# Patient Record
Sex: Female | Born: 1950 | Race: White | Hispanic: No | Smoking: Current every day smoker
Health system: Southern US, Community
[De-identification: ages and names within clinical notes are randomized; demographics above are authoritative.]

## PROBLEM LIST (undated history)

## (undated) HISTORY — PX: APPENDECTOMY: SHX54

---

## 2015-12-08 ENCOUNTER — Emergency Department (INDEPENDENT_AMBULATORY_CARE_PROVIDER_SITE_OTHER)
Admission: EM | Admit: 2015-12-08 | Discharge: 2015-12-08 | Disposition: A | Discharge status: Home / Self Care | Source: Home / Self Care | Attending: Family Medicine | Admitting: Family Medicine

## 2015-12-08 ENCOUNTER — Emergency Department (INDEPENDENT_AMBULATORY_CARE_PROVIDER_SITE_OTHER)

## 2015-12-08 ENCOUNTER — Encounter: Payer: Self-pay | Admitting: Emergency Medicine

## 2015-12-08 DIAGNOSIS — S2241XA Multiple fractures of ribs, right side, initial encounter for closed fracture: Secondary | ICD-10-CM

## 2015-12-08 DIAGNOSIS — W19XXXA Unspecified fall, initial encounter: Secondary | ICD-10-CM

## 2015-12-08 MED ORDER — KETOROLAC TROMETHAMINE 30 MG/ML IJ SOLN
30.0000 mg | Freq: Once | INTRAMUSCULAR | Status: AC
  Administered 2015-12-08: 30 mg via INTRAMUSCULAR

## 2015-12-08 MED ORDER — HYDROCODONE-ACETAMINOPHEN 10-325 MG PO TABS: 1.0000 | ORAL_TABLET | Freq: Four times a day (QID) | ORAL | 0 refills | Status: AC | PRN

## 2015-12-08 NOTE — ED Provider Notes (Addendum)
[facility]    CSN: [id number] Arrival date & time: 12/08/15  1354     History   Chief Complaint Chief Complaint  Patient presents with  . Fall    HPI Tasha Green is a 65 y.o. female.   Patient reports that she slipped in her tub last night and fell, landing on her right ribs and posterior chest.  She denies shortness of breath.  No loss of consciousness.   The history is provided by the patient.  Fall  This is a new problem. The current episode started yesterday. The problem has not changed since onset.Associated symptoms include chest pain. Pertinent negatives include no abdominal pain and no shortness of breath. The symptoms are aggravated by walking, coughing and twisting. Nothing relieves the symptoms. Treatments tried: Aleve. The treatment provided no relief.    History reviewed. No pertinent past medical history.  There are no active problems to display for this patient.   Past Surgical History:  Procedure Laterality Date  . APPENDECTOMY      OB History    No data available       Home Medications    Prior to Admission medications   Medication Sig Start Date End Date Taking? Authorizing Provider  aspirin 325 MG tablet Take 325 mg by mouth daily.   Yes Historical Provider, MD  cholecalciferol (VITAMIN D) 400 units TABS tablet Take 400 Units by mouth.   Yes Historical Provider, MD  Omega-3 Fatty Acids (FISH OIL) 1000 MG CAPS Take by mouth.   Yes Historical Provider, MD  verapamil (CALAN) 120 MG tablet Take 120 mg by mouth 3 (three) times daily.   Yes Historical Provider, MD  vitamin B-12 (CYANOCOBALAMIN) 100 MCG tablet Take 100 mcg by mouth daily.   Yes Historical Provider, MD  HYDROcodone-acetaminophen (NORCO) 10-325 MG tablet Take 1 tablet by mouth every 6 (six) hours as needed. 12/08/15   Lattie HawStephen A Beese, MD    Family History No family history on file.  Social History Social History  Substance Use Topics  . Smoking status: Current  Every Day Smoker    Packs/day: 1.00    Years: 50.00    Types: Cigarettes  . Smokeless tobacco: Never Used  . Alcohol use Yes     Allergies   Patient has no known allergies.   Review of Systems Review of Systems  Respiratory: Negative for shortness of breath.   Cardiovascular: Positive for chest pain.  Gastrointestinal: Negative for abdominal pain.  All other systems reviewed and are negative.    Physical Exam Triage Vital Signs ED Triage Vitals  Enc Vitals Group     BP 12/08/15 1429 180/94     Pulse Rate 12/08/15 1429 78     Resp --      Temp 12/08/15 1429 98 F (36.7 C)     Temp Source 12/08/15 1429 Oral     SpO2 12/08/15 1429 97 %     Weight --      Height --      Head Circumference --      Peak Flow --      Pain Score 12/08/15 1434 9     Pain Loc --      Pain Edu? --      Excl. in GC? --    No data found.   Updated Vital Signs BP 180/94 (BP Location: Left Arm)   Pulse 78   Temp 98 F (36.7 C) (Oral)   SpO2 97%  Visual Acuity Right Eye Distance:   Left Eye Distance:   Bilateral Distance:    Right Eye Near:   Left Eye Near:    Bilateral Near:     Physical Exam  Constitutional: She appears well-developed and well-nourished. No distress.  HENT:  Head: Atraumatic.  Right Ear: External ear normal.  Left Ear: External ear normal.  Nose: Nose normal.  Mouth/Throat: Oropharynx is clear and moist.  Eyes: Conjunctivae are normal. Pupils are equal, round, and reactive to light.  Neck: Normal range of motion.  Cardiovascular: Normal heart sounds.   Pulmonary/Chest: Effort normal and breath sounds normal. No respiratory distress.     She exhibits tenderness.    There is a hematoma with tenderness to palpation over right scapula as noted on diagram.  No ecchymosis.  There is tenderness to palpation over right anterior/inferior ribs as noted on diagram.   Abdominal: There is no tenderness.  Musculoskeletal: She exhibits no edema.  Neurological:  She is alert.  Skin: Skin is warm and dry. She is not diaphoretic.  Nursing note and vitals reviewed.    UC Treatments / Results  Labs (all labs ordered are listed, but only abnormal results are displayed) Labs Reviewed - No data to display  EKG  EKG Interpretation None       Radiology EXAM: RIGHT RIBS AND CHEST - 3+ VIEW  COMPARISON:  None.  FINDINGS: There are fractures involving the right seventh posterior rib in addition to 2 fractures in the right eighth rib, posteriorly and laterally. There is pleural thickening adjacent to the right lateral seventh rib fracture. No evidence of pneumothorax identified. Mild atelectasis in the right base. Mild left basilar atelectasis. The heart, hila, and mediastinum are normal. No other acute abnormalities.  IMPRESSION: There is a mildly displaced rib fracture involving the right posterior seventh rib in addition to 2 fractures involving the right posterior and lateral eighth rib. No pneumothorax.   Electronically Signed   By: Gerome Samavid  Williams III M.D   On: 12/08/2015 14:52  Procedures Procedures (including critical care time)  Medications Ordered in UC Medications  ketorolac (TORADOL) 30 MG/ML injection 30 mg (30 mg Intramuscular Given 12/08/15 1459)     Initial Impression / Assessment and Plan / UC Course  I have reviewed the triage vital signs and the nursing notes.  Pertinent labs & imaging results that were available during my care of the patient were reviewed by me and considered in my medical decision making (see chart for details).  Clinical Course   No evidence pneumothorax. Dispensed rib belt. Rx for Norco (#15, no refill) Apply ice pack for 20 to 30 minutes, 3 to 4 times daily  Continue until pain decreases.  Wear rib belt as needed, but not continuously, and discontinue as soon as possible.  May continue Aleve, 2 tabs twice daily with food. If symptoms become significantly worse during the night or  over the weekend, proceed to the local emergency room.  Followup with Family Doctor in about one week.     Final Clinical Impressions(s) / UC Diagnoses   Final diagnoses:  Closed fracture of multiple ribs of right side, initial encounter    New Prescriptions Discharge Medication List as of 12/08/2015  3:28 PM       Lattie HawStephen A Beese, MD 12/12/15 1657  Addendum: Rx written for incentive spirometer:  Advised patient to use several times daily.   Lattie HawStephen A Beese, MD 12/12/15 [id number]

## 2015-12-08 NOTE — ED Triage Notes (Signed)
Patient slipped and fell in tub last night landing on right ribs

## 2015-12-08 NOTE — Discharge Instructions (Signed)
Apply ice pack for 20 to 30 minutes, 3 to 4 times daily  Continue until pain decreases.  Wear rib belt as needed, but not continuously, and discontinue as soon as possible.  May continue Aleve, 2 tabs twice daily with food. If symptoms become significantly worse during the night or over the weekend, proceed to the local emergency room.

## 2015-12-12 ENCOUNTER — Telehealth: Payer: Self-pay | Admitting: *Deleted

## 2015-12-12 NOTE — Telephone Encounter (Signed)
Pt called requesting a refill on her hydrocodone. Advised her she should follow up with her PCP for a recheck. She sch'ed an appt with her PCP for 12/13/15.

## 2017-11-07 IMAGING — DX DG RIBS W/ CHEST 3+V*R*
4 series · 4 of 4 positions shown · non-contrast
Comparison: None.

CLINICAL DATA: Pain after fall.

EXAM:
RIGHT RIBS AND CHEST - 3+ VIEW

[chest pa]
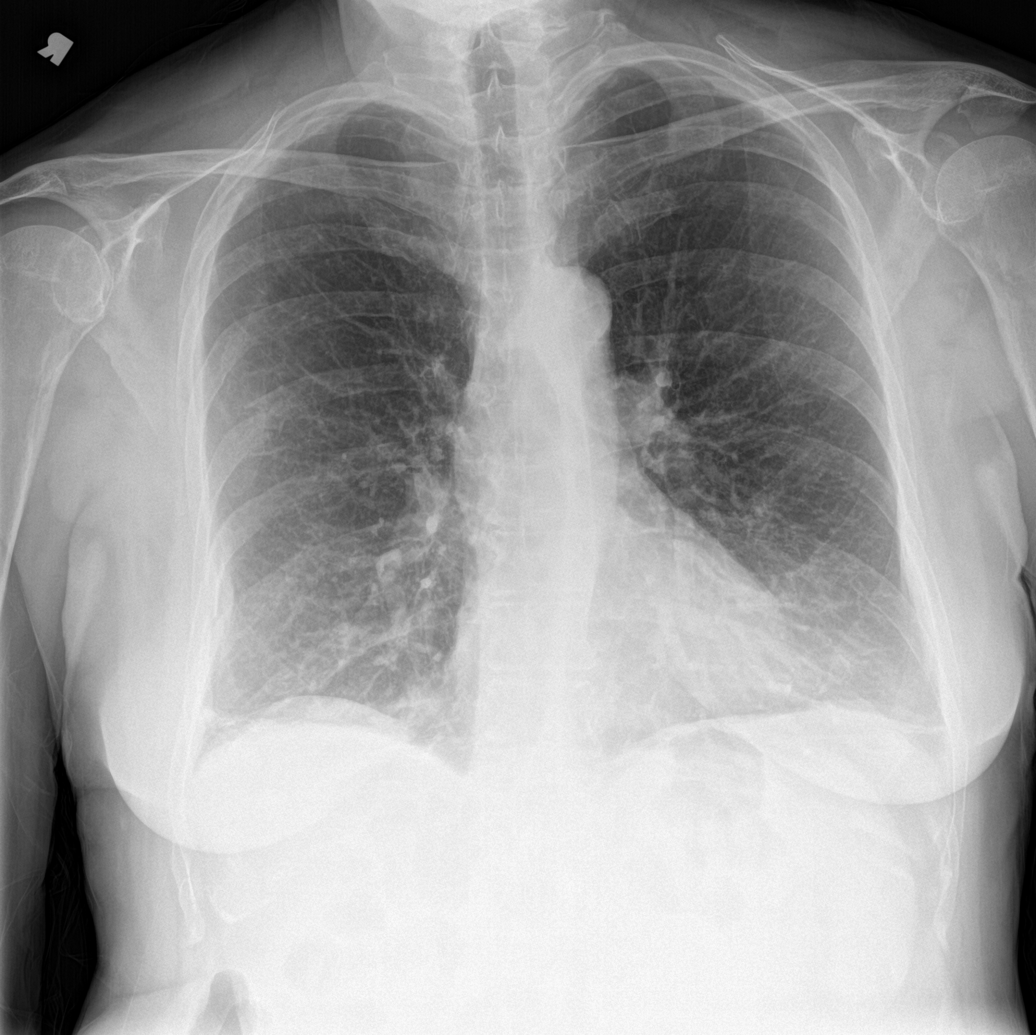

[rib pa]
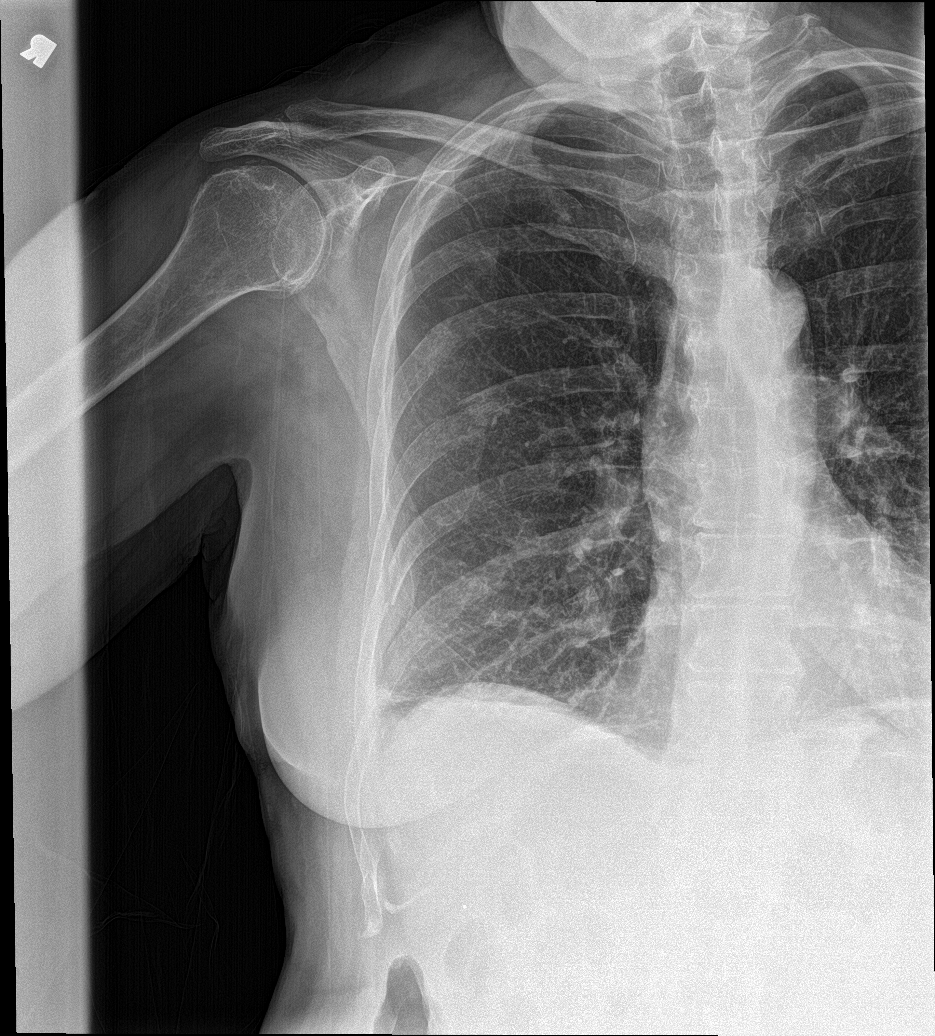

[rib ap]
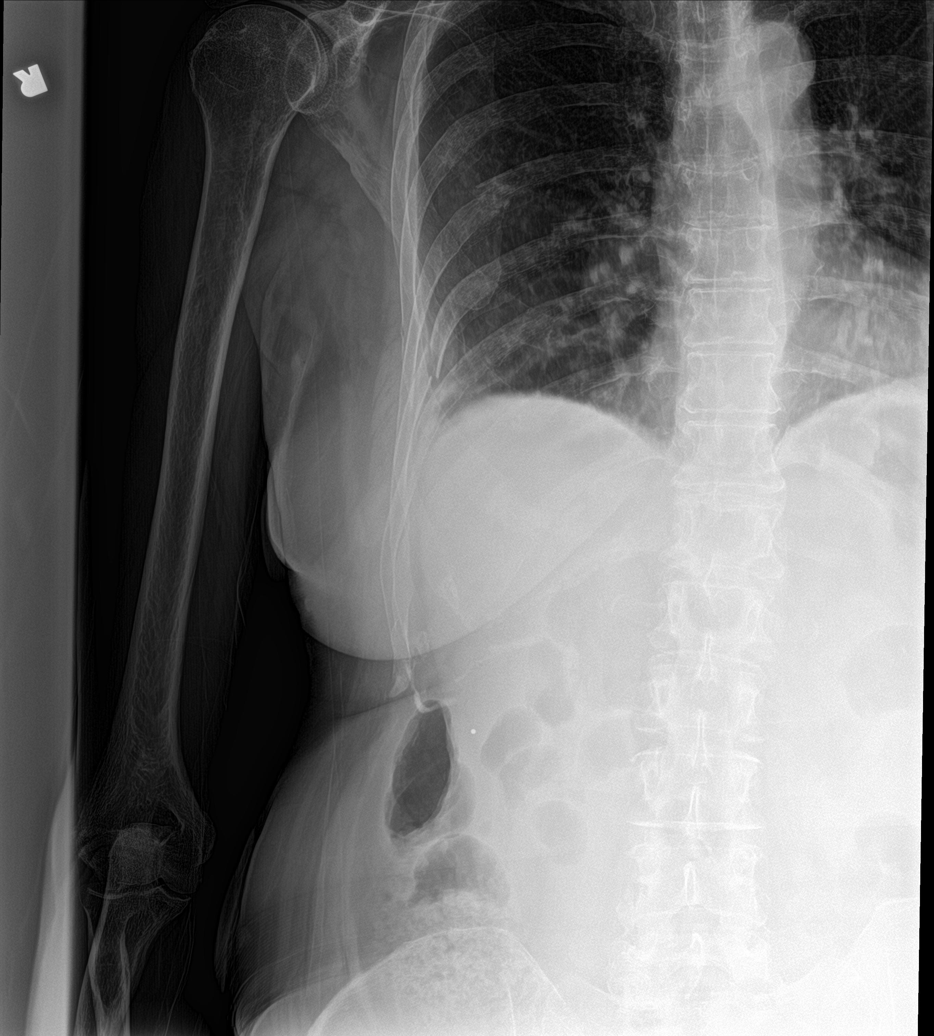

[rib pa obl]
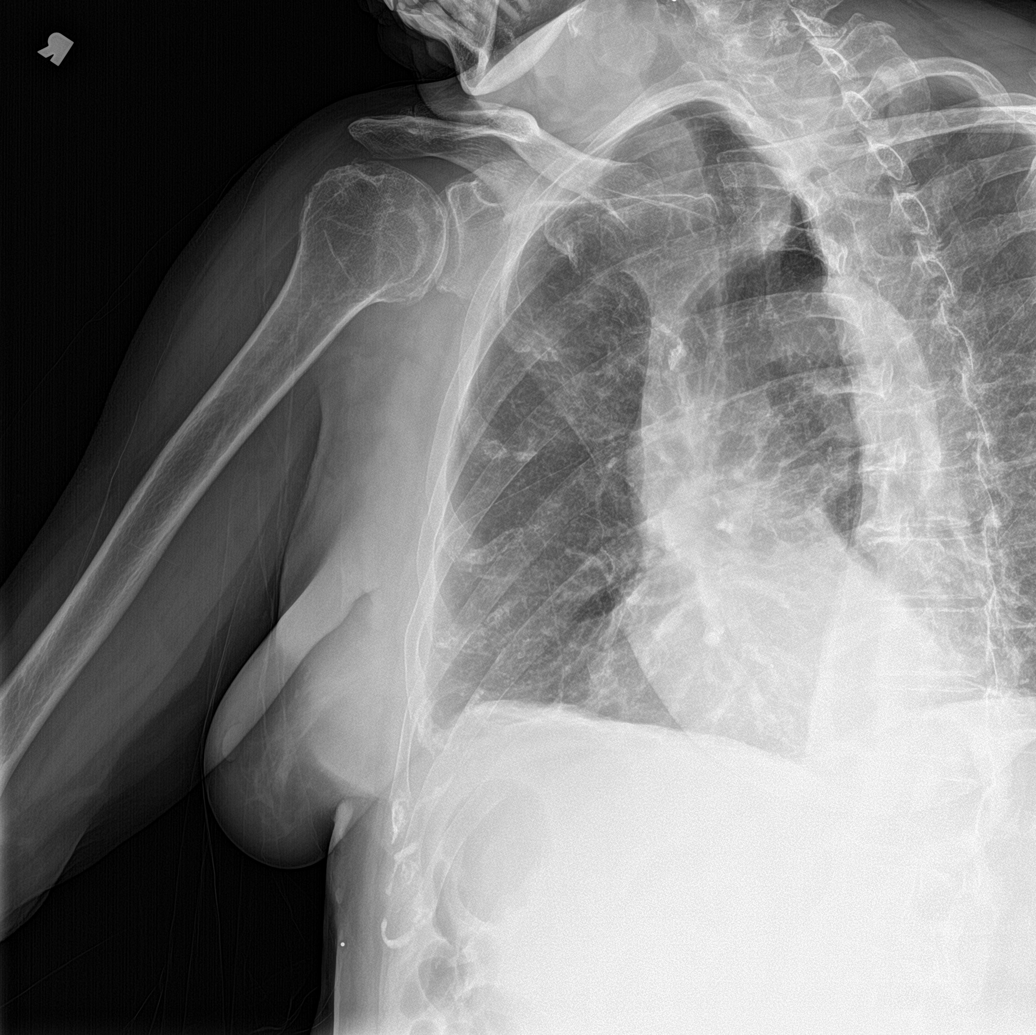

[4 of 4 positions shown; findings below may reference images not displayed]

FINDINGS: There are fractures involving the right seventh posterior rib in
addition to 2 fractures in the right eighth rib, posteriorly and
laterally. There is pleural thickening adjacent to the right lateral
seventh rib fracture. No evidence of pneumothorax identified. Mild
atelectasis in the right base. Mild left basilar atelectasis. The
heart, hila, and mediastinum are normal. No other acute
abnormalities.
IMPRESSION: There is a mildly displaced rib fracture involving the right
posterior seventh rib in addition to 2 fractures involving the right
posterior and lateral eighth rib. No pneumothorax.
